# Patient Record
Sex: Female | Born: 1950 | Hispanic: No | Marital: Single
Health system: Southern US, Community
[De-identification: ages and names within clinical notes are randomized; demographics above are authoritative.]

---

## 2004-11-22 ENCOUNTER — Ambulatory Visit: Payer: Self-pay

## 2006-02-27 ENCOUNTER — Ambulatory Visit: Payer: Self-pay

## 2007-03-05 ENCOUNTER — Ambulatory Visit: Payer: Self-pay

## 2007-03-16 ENCOUNTER — Emergency Department (HOSPITAL_COMMUNITY): Admission: EM | Admit: 2007-03-16 | Discharge: 2007-03-16 | Payer: Self-pay | Admitting: Emergency Medicine

## 2008-05-18 ENCOUNTER — Ambulatory Visit: Payer: Self-pay

## 2008-05-26 ENCOUNTER — Ambulatory Visit: Payer: Self-pay

## 2009-05-31 ENCOUNTER — Ambulatory Visit: Payer: Self-pay

## 2010-05-31 ENCOUNTER — Ambulatory Visit: Payer: Self-pay

## 2011-08-08 ENCOUNTER — Ambulatory Visit: Payer: Self-pay

## 2012-10-28 ENCOUNTER — Ambulatory Visit: Payer: Self-pay

## 2016-01-09 ENCOUNTER — Ambulatory Visit
Admission: RE | Admit: 2016-01-09 | Discharge: 2016-01-09 | Disposition: A | Discharge status: Home / Self Care | Payer: Self-pay | Source: Ambulatory Visit | Attending: Oncology | Admitting: Oncology

## 2016-01-09 ENCOUNTER — Encounter: Payer: Self-pay | Admitting: *Deleted

## 2016-01-09 ENCOUNTER — Ambulatory Visit: Payer: Self-pay | Attending: Oncology | Admitting: *Deleted

## 2016-01-09 VITALS — BP 128/77 | HR 70 | Temp 97.6°F | Ht 59.45 in | Wt 144.1 lb

## 2016-01-09 DIAGNOSIS — Z0000 Encounter for general adult medical examination without abnormal findings: Secondary | ICD-10-CM

## 2016-01-09 NOTE — Patient Instructions (Signed)
Gave patient hand-out, Women Staying Healthy, Active and Well from [facility], with education on breast health, pap smears, heart and colon health. 

## 2016-01-09 NOTE — Progress Notes (Signed)
Subjective:     Patient ID: Valla Leaverhuylan T Celia, female   DOB: 11-Jul-1951, 65 y.o.   MRN: [id number]  HPI   Review of Systems     Objective:   Physical Exam  Pulmonary/Chest: Right breast exhibits no inverted nipple, no mass, no nipple discharge, no skin change and no tenderness. Left breast exhibits no inverted nipple, no mass, no nipple discharge, no skin change and no tenderness. Breasts are symmetrical.  Large pendulous breast       Assessment:     65 [facility]  Clinical breast exam unremarkable.  Taught self breast exam.  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  Per my notes [facility].  Per patient she did follow-up and has for the past 2 years with normal paps.  [facility] and last pap on 12/16/14 was HPV negative and normal.  [facility] and [facility].  Last mammo here was in 2014. Patient has been screened for eligibility.  She does not have any insurance, Medicare or Medicaid.  She also meets financial eligibility.  Hand-out given on the Affordable Care Act.    Plan:     Screening mammogram ordered.  Will follow-up per [facility] protocol.

## 2016-02-03 ENCOUNTER — Encounter: Payer: Self-pay | Admitting: *Deleted

## 2016-02-03 NOTE — Progress Notes (Signed)
Letter mailed from [facility] to inform patient of her normal mammogram results.  Patient is to follow-up with annual screening in one year.  HSIS to Christy. 

## 2017-05-21 ENCOUNTER — Other Ambulatory Visit: Payer: Self-pay | Admitting: Internal Medicine

## 2017-05-21 DIAGNOSIS — Z1231 Encounter for screening mammogram for malignant neoplasm of breast: Secondary | ICD-10-CM

## 2017-06-03 ENCOUNTER — Ambulatory Visit
Admission: RE | Admit: 2017-06-03 | Discharge: 2017-06-03 | Disposition: A | Discharge status: Home / Self Care | Payer: BLUE CROSS/BLUE SHIELD | Source: Ambulatory Visit | Attending: Internal Medicine | Admitting: Internal Medicine

## 2017-06-03 DIAGNOSIS — Z1231 Encounter for screening mammogram for malignant neoplasm of breast: Secondary | ICD-10-CM | POA: Insufficient documentation

## 2018-05-20 ENCOUNTER — Other Ambulatory Visit: Payer: Self-pay | Admitting: Internal Medicine

## 2018-05-20 DIAGNOSIS — Z1231 Encounter for screening mammogram for malignant neoplasm of breast: Secondary | ICD-10-CM

## 2018-06-10 ENCOUNTER — Other Ambulatory Visit: Payer: Self-pay | Admitting: Internal Medicine

## 2018-06-10 DIAGNOSIS — N951 Menopausal and female climacteric states: Secondary | ICD-10-CM

## 2018-06-16 ENCOUNTER — Ambulatory Visit
Admission: RE | Admit: 2018-06-16 | Discharge: 2018-06-16 | Disposition: A | Discharge status: Home / Self Care | Payer: Medicare Other | Source: Ambulatory Visit | Attending: Internal Medicine | Admitting: Internal Medicine

## 2018-06-16 DIAGNOSIS — Z1231 Encounter for screening mammogram for malignant neoplasm of breast: Secondary | ICD-10-CM | POA: Insufficient documentation

## 2018-07-16 ENCOUNTER — Other Ambulatory Visit: Payer: BLUE CROSS/BLUE SHIELD

## 2018-08-05 ENCOUNTER — Other Ambulatory Visit: Payer: Medicare Other

## 2019-05-22 ENCOUNTER — Other Ambulatory Visit: Payer: Self-pay | Admitting: Internal Medicine

## 2019-05-26 ENCOUNTER — Other Ambulatory Visit: Payer: Self-pay | Admitting: Internal Medicine

## 2019-05-26 DIAGNOSIS — Z1231 Encounter for screening mammogram for malignant neoplasm of breast: Secondary | ICD-10-CM

## 2019-09-07 ENCOUNTER — Ambulatory Visit
Admission: RE | Admit: 2019-09-07 | Discharge: 2019-09-07 | Disposition: A | Discharge status: Home / Self Care | Payer: Medicare Other | Source: Ambulatory Visit | Attending: Internal Medicine | Admitting: Internal Medicine

## 2019-09-07 DIAGNOSIS — Z1231 Encounter for screening mammogram for malignant neoplasm of breast: Secondary | ICD-10-CM | POA: Diagnosis present

## 2020-03-17 ENCOUNTER — Other Ambulatory Visit: Payer: Self-pay | Admitting: Internal Medicine

## 2020-03-17 DIAGNOSIS — M5441 Lumbago with sciatica, right side: Secondary | ICD-10-CM

## 2020-04-24 ENCOUNTER — Ambulatory Visit: Admission: RE | Admit: 2020-04-24 | Payer: Medicare Other | Source: Ambulatory Visit

## 2020-05-17 ENCOUNTER — Ambulatory Visit: Admission: RE | Admit: 2020-05-17 | Payer: Medicare Other | Source: Ambulatory Visit

## 2020-06-02 ENCOUNTER — Other Ambulatory Visit: Payer: Self-pay | Admitting: Internal Medicine

## 2020-06-08 ENCOUNTER — Ambulatory Visit: Payer: Medicare Other

## 2020-06-28 ENCOUNTER — Ambulatory Visit: Admission: RE | Admit: 2020-06-28 | Payer: Medicare Other | Source: Ambulatory Visit

## 2020-11-15 ENCOUNTER — Other Ambulatory Visit: Payer: Self-pay | Admitting: Internal Medicine

## 2020-11-15 DIAGNOSIS — Z1231 Encounter for screening mammogram for malignant neoplasm of breast: Secondary | ICD-10-CM

## 2021-01-24 ENCOUNTER — Ambulatory Visit
Admission: RE | Admit: 2021-01-24 | Discharge: 2021-01-24 | Disposition: A | Discharge status: Home / Self Care | Payer: Medicare Other | Source: Ambulatory Visit | Attending: Internal Medicine | Admitting: Internal Medicine

## 2021-01-24 ENCOUNTER — Other Ambulatory Visit: Payer: Self-pay

## 2021-01-24 DIAGNOSIS — Z1231 Encounter for screening mammogram for malignant neoplasm of breast: Secondary | ICD-10-CM | POA: Insufficient documentation

## 2021-12-04 IMAGING — MG MM DIGITAL SCREENING BILAT W/ TOMO AND CAD
6 of 10 series · 6 of 30 positions shown · non-contrast
Comparison: Previous exam(s).

CLINICAL DATA: Screening.

EXAM:
DIGITAL SCREENING BILATERAL MAMMOGRAM WITH TOMOSYNTHESIS AND CAD
TECHNIQUE: Bilateral screening digital craniocaudal and mediolateral oblique
mammograms were obtained. Bilateral screening digital breast
tomosynthesis was performed. The images were evaluated with
computer-aided detection.

[L MLO synth-2D]
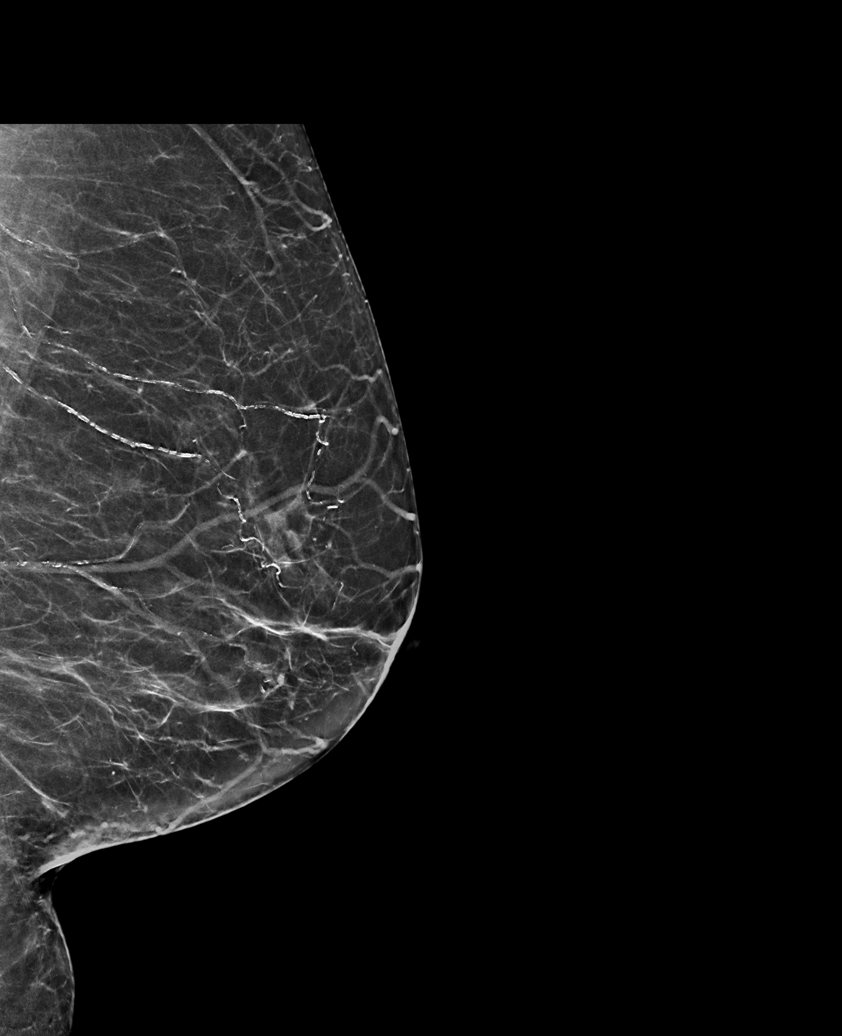

[R CC synth-2D]
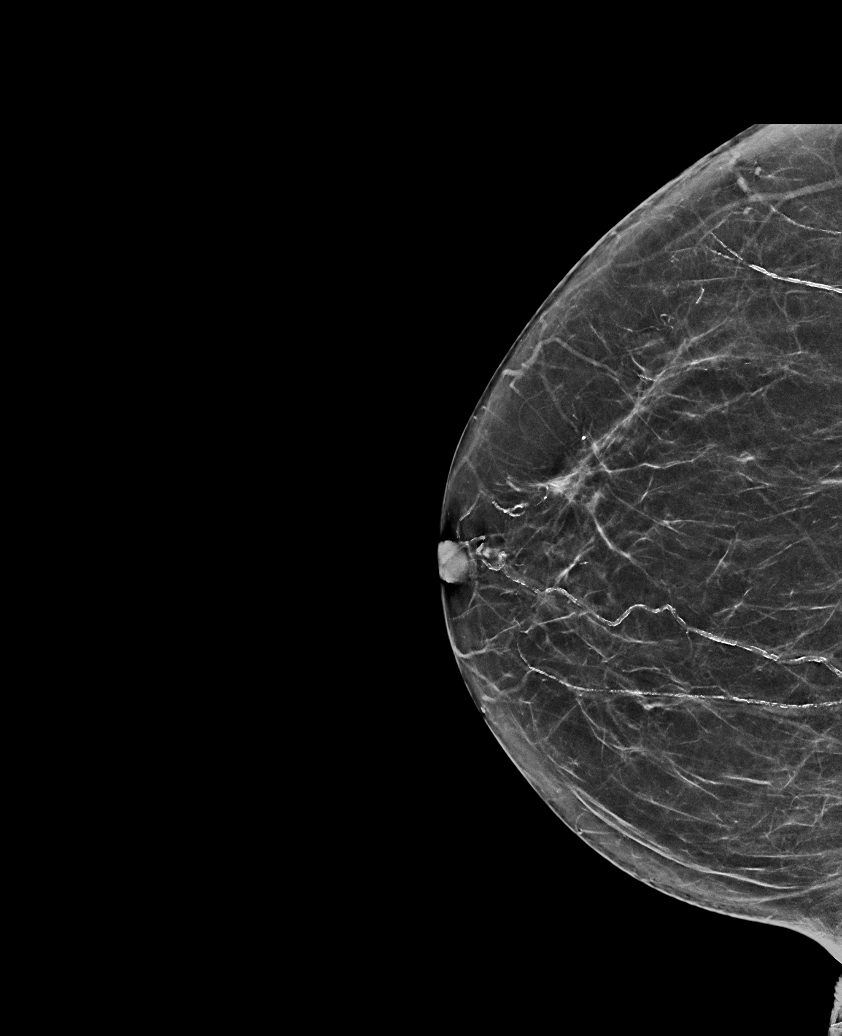

[R MLO synth-2D]
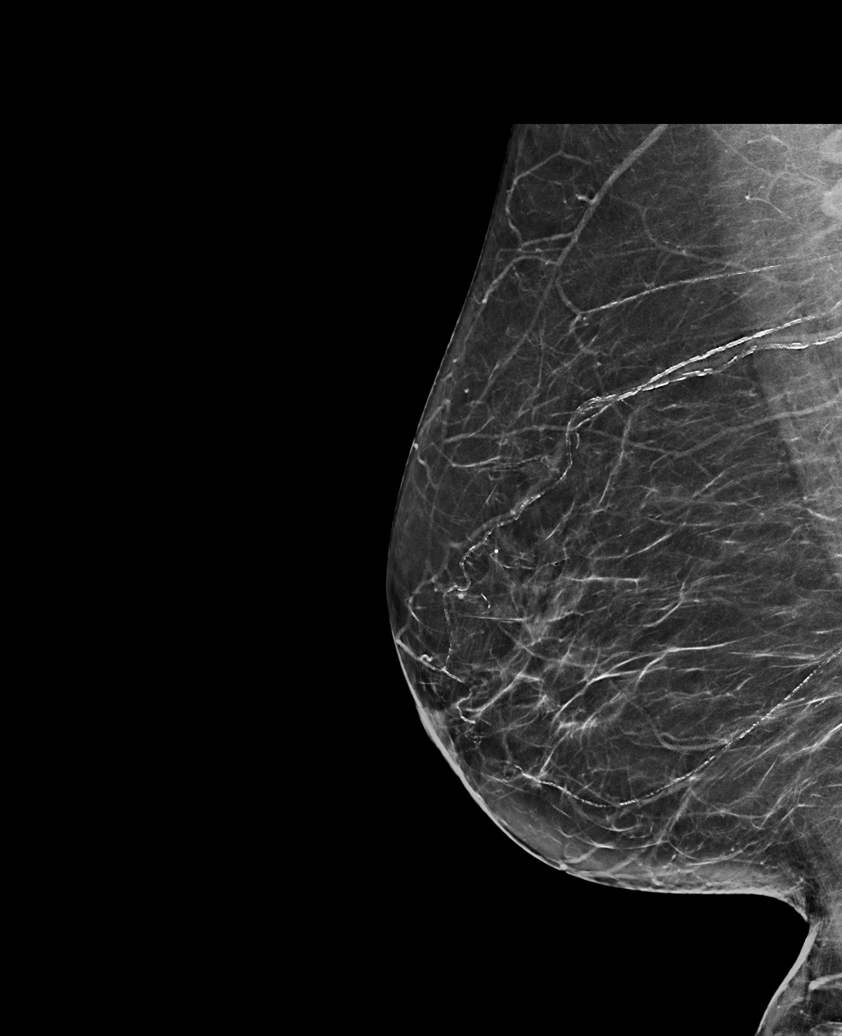

[L AT synth-2D]
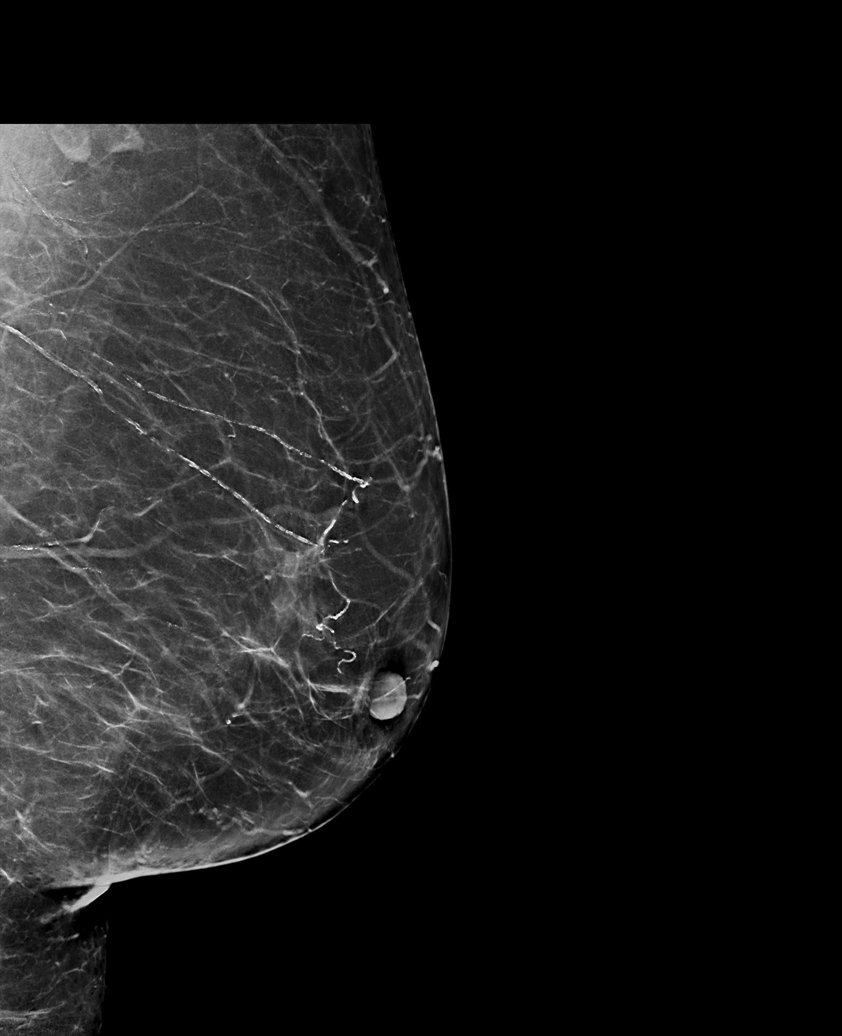

[L CC synth-2D]
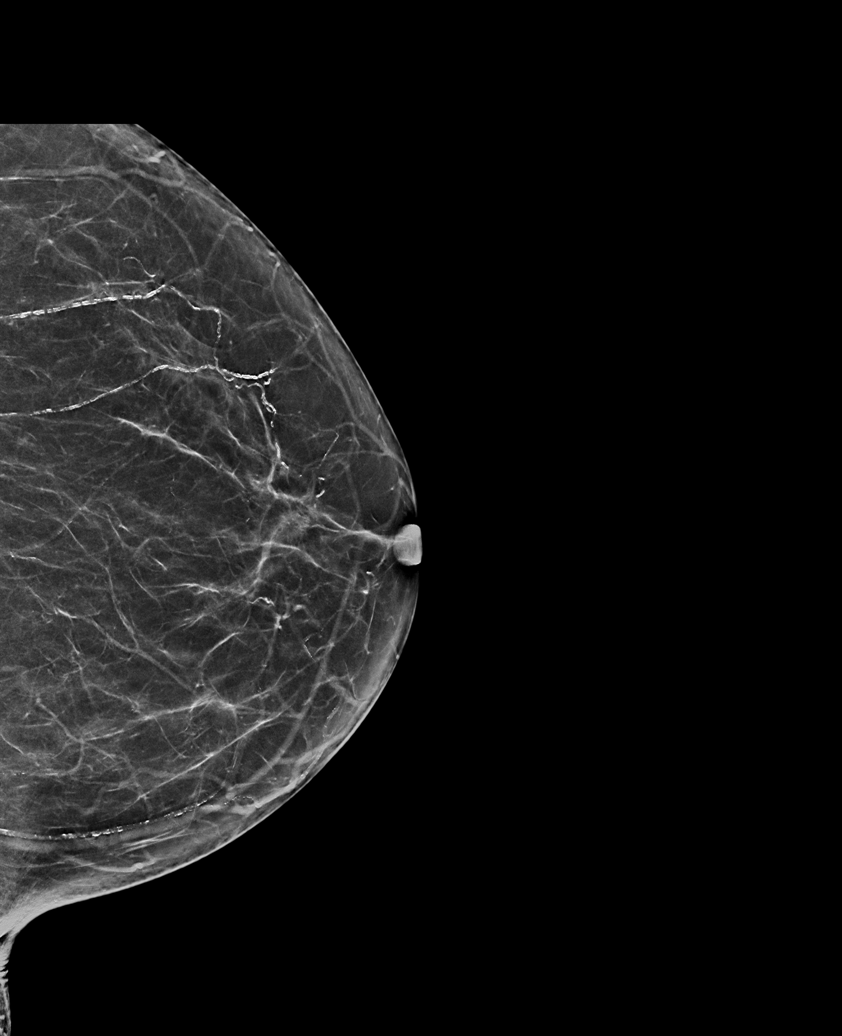

[L AT tomo · tomo slice 41/81.0]
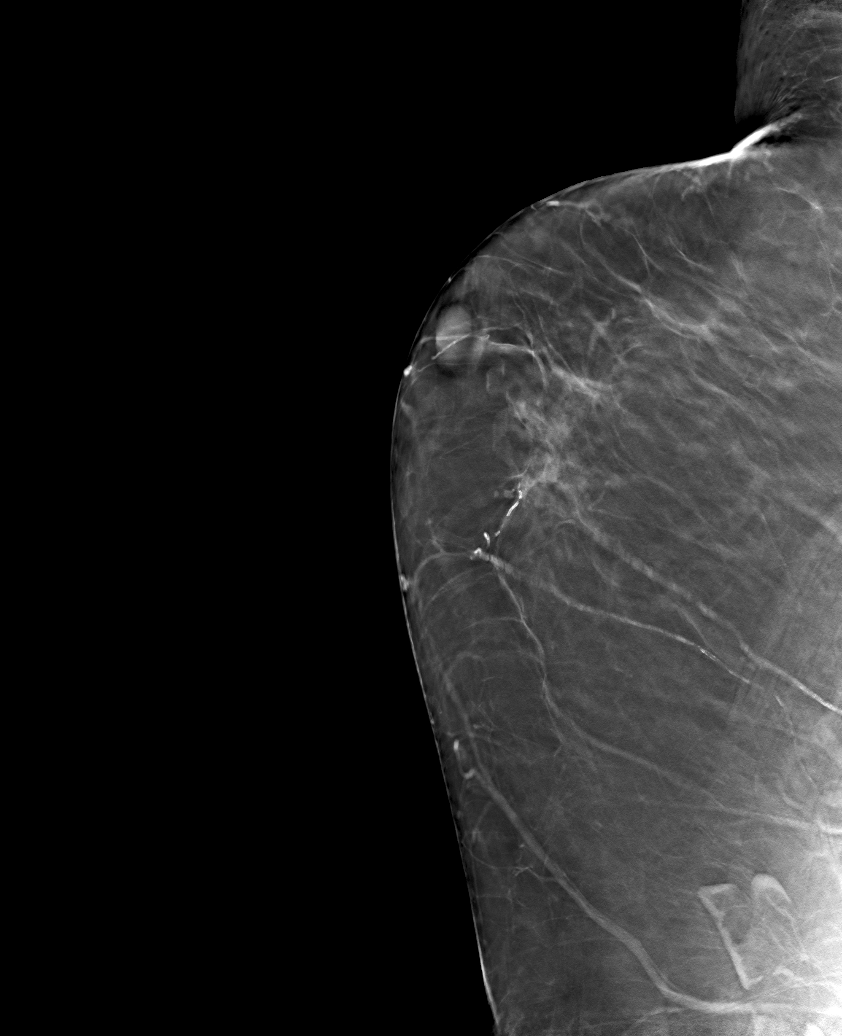

[6 of 30 positions shown; findings below may reference images not displayed]

ACR Breast Density Category b: There are scattered areas of
fibroglandular density.
FINDINGS: There are no findings suspicious for malignancy. The images were
evaluated with computer-aided detection.
IMPRESSION: No mammographic evidence of malignancy. A result letter of this
screening mammogram will be mailed directly to the patient.

RECOMMENDATION:
Screening mammogram in one year. (Code:WJ-[id number])

BI-RADS CATEGORY  1: Negative.

## 2023-03-19 ENCOUNTER — Encounter: Payer: Self-pay | Admitting: Obstetrics and Gynecology

## 2023-03-20 ENCOUNTER — Telehealth: Payer: Self-pay | Admitting: Obstetrics and Gynecology

## 2023-03-20 NOTE — Telephone Encounter (Signed)
This pt called in about her abnormal pap smear. There is a referral for her, but it has been closed.  Says unable to contact.  Can this referral be reopened?  She said that she was at work, but a message could be left on her voice mail.

## 2023-04-02 ENCOUNTER — Other Ambulatory Visit (HOSPITAL_COMMUNITY)
Admission: RE | Admit: 2023-04-02 | Discharge: 2023-04-02 | Disposition: A | Discharge status: Home / Self Care | Payer: Medicare Other | Source: Ambulatory Visit | Attending: Obstetrics and Gynecology | Admitting: Obstetrics and Gynecology

## 2023-04-02 ENCOUNTER — Ambulatory Visit (INDEPENDENT_AMBULATORY_CARE_PROVIDER_SITE_OTHER): Payer: Medicare Other | Admitting: Obstetrics and Gynecology

## 2023-04-02 ENCOUNTER — Encounter: Payer: Self-pay | Admitting: Obstetrics and Gynecology

## 2023-04-02 VITALS — BP 135/67 | HR 82 | Resp 16 | Ht 60.0 in | Wt 137.1 lb

## 2023-04-02 DIAGNOSIS — R87612 Low grade squamous intraepithelial lesion on cytologic smear of cervix (LGSIL): Secondary | ICD-10-CM | POA: Insufficient documentation

## 2023-04-02 DIAGNOSIS — N870 Mild cervical dysplasia: Secondary | ICD-10-CM

## 2023-04-02 DIAGNOSIS — Z87410 Personal history of cervical dysplasia: Secondary | ICD-10-CM | POA: Diagnosis present
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
## 2023-04-02 NOTE — Addendum Note (Signed)
Addended by: Tommie Raymond on: 04/02/2023 01:28 PM   Modules accepted: Orders

## 2023-04-02 NOTE — Patient Instructions (Addendum)
Colposcopy, Care After  The following information offers guidance on how to care for yourself after your procedure. Your health care provider may also give you more specific instructions. If you have problems or questions, contact your health care provider. What can I expect after the procedure? If you had a colposcopy without a biopsy, you can expect to feel fine right away after your procedure. However, you may have some spotting of blood for a few days. You can return to your normal activities. If you had a colposcopy with a biopsy, it is common after the procedure to have: Soreness and mild pain. These may last for a few days. Mild vaginal bleeding or discharge that is dark-colored and grainy. This may last for a few days. The discharge may be caused by a liquid (solution) that was used during the procedure. You may need to wear a sanitary pad during this time. Spotting of blood for at least 48 hours after the procedure. Follow these instructions at home: Medicines Take over-the-counter and prescription medicines only as told by your health care provider. Talk with your health care provider about what type of over-the-counter pain medicines and prescription medicines you can start to take again. It is especially important to talk with your health care provider if you take blood thinners. Activity Avoid using douche products, using tampons, and having sex for at least 3 days after the procedure or for as long as told by your health care provider. Return to your normal activities as told by your health care provider. Ask your health care provider what activities are safe for you. General instructions Ask your health care provider if you may take baths, swim, or use a hot tub. You may take showers. If you use birth control (contraception), continue to use it. Keep all follow-up visits. This is important. Contact a health care provider if: You have a fever or chills. You faint or feel  light-headed. Get help right away if: You have heavy bleeding from your vagina or pass blood clots. Heavy bleeding is bleeding that soaks through a sanitary pad in less than 1 hour. You have vaginal discharge that is abnormal, is yellow in color, or smells bad. This could be a sign of infection. You have severe pain or cramps in your lower abdomen that do not go away with medicine. Summary If you had a colposcopy without a biopsy, you can expect to feel fine right away, but you may have some spotting of blood for a few days. You can return to your normal activities. If you had a colposcopy with a biopsy, it is common to have mild pain for a few days and spotting for 48 hours after the procedure. Avoid using douche products, using tampons, and having sex for at least 3 days after the procedure or for as long as told by your health care provider. Get help right away if you have heavy bleeding, severe pain, or signs of infection. This information is not intended to replace advice given to you by your health care provider. Make sure you discuss any questions you have with your health care provider. Document Revised: 01/01/2021 Document Reviewed: 01/01/2021 Elsevier Patient Education  2024 [employer]  

## 2024-05-19 ENCOUNTER — Other Ambulatory Visit: Payer: Self-pay | Admitting: Family Medicine

## 2024-05-19 DIAGNOSIS — Z1231 Encounter for screening mammogram for malignant neoplasm of breast: Secondary | ICD-10-CM
